# Patient Record
Sex: Female | Born: 1940 | Race: Black or African American | Hispanic: No | Marital: Married | Smoking: Never smoker
Health system: Southern US, Community
[De-identification: ages and names within clinical notes are randomized; demographics above are authoritative.]

## PROBLEM LIST (undated history)

## (undated) DIAGNOSIS — M1990 Unspecified osteoarthritis, unspecified site: Secondary | ICD-10-CM

## (undated) DIAGNOSIS — Z9109 Other allergy status, other than to drugs and biological substances: Secondary | ICD-10-CM

## (undated) HISTORY — PX: BIOPSY THYROID: PRO38

## (undated) HISTORY — PX: BREAST BIOPSY: SHX20

## (undated) HISTORY — PX: COLONOSCOPY: SHX174

---

## 2006-04-22 ENCOUNTER — Ambulatory Visit: Payer: Self-pay | Admitting: Internal Medicine

## 2006-06-03 ENCOUNTER — Ambulatory Visit: Payer: Self-pay | Admitting: Gastroenterology

## 2009-11-08 ENCOUNTER — Ambulatory Visit: Payer: Self-pay | Admitting: Family Medicine

## 2011-04-09 ENCOUNTER — Ambulatory Visit: Payer: Self-pay | Admitting: Family Medicine

## 2011-06-19 ENCOUNTER — Ambulatory Visit: Payer: Self-pay | Admitting: Family Medicine

## 2012-05-11 ENCOUNTER — Ambulatory Visit: Payer: Self-pay | Admitting: Family Medicine

## 2013-03-30 ENCOUNTER — Ambulatory Visit: Payer: Self-pay | Admitting: Family Medicine

## 2013-04-02 ENCOUNTER — Ambulatory Visit: Payer: Self-pay | Admitting: Family Medicine

## 2013-05-31 ENCOUNTER — Ambulatory Visit: Payer: Self-pay | Admitting: Family Medicine

## 2013-06-01 ENCOUNTER — Ambulatory Visit: Payer: Self-pay | Admitting: Family Medicine

## 2013-06-07 ENCOUNTER — Ambulatory Visit: Payer: Self-pay | Admitting: Family Medicine

## 2013-06-11 LAB — PATHOLOGY REPORT

## 2014-09-30 ENCOUNTER — Other Ambulatory Visit: Payer: Self-pay | Admitting: Family Medicine

## 2014-09-30 DIAGNOSIS — E049 Nontoxic goiter, unspecified: Secondary | ICD-10-CM

## 2014-10-10 ENCOUNTER — Ambulatory Visit: Payer: Self-pay

## 2015-02-01 IMAGING — US ULTRASOUND CORE BIOPSY
1 series · 13 of 25 positions shown · non-contrast
Comparison: none

CLINICAL DATA: Multi nodular goiter.

[Series 1: ultrasound core biopsy · 0.04mm/px · 13 of 35 slices shown]
[im 1/35]
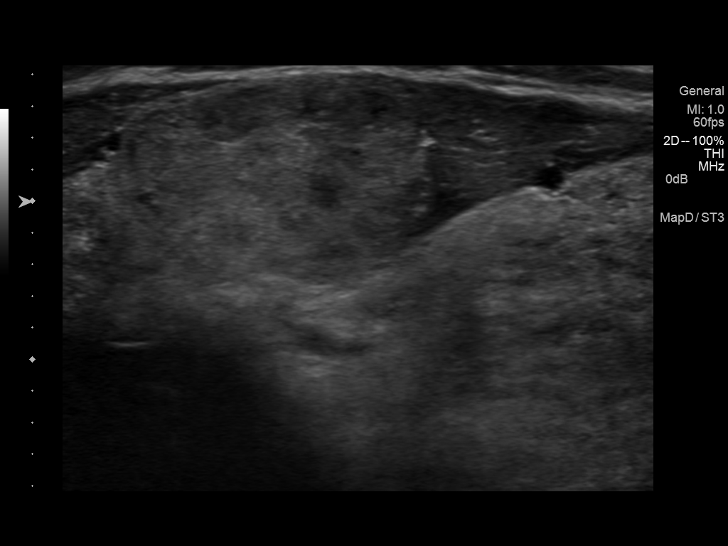
[im 3/35]
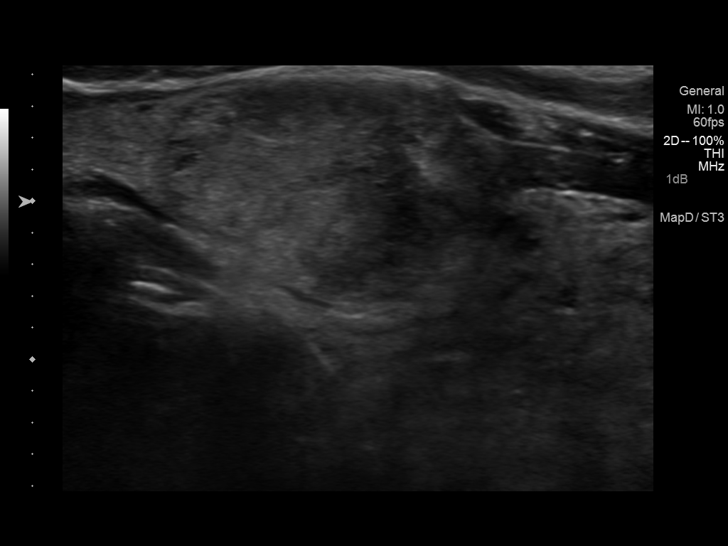
[im 6/35]
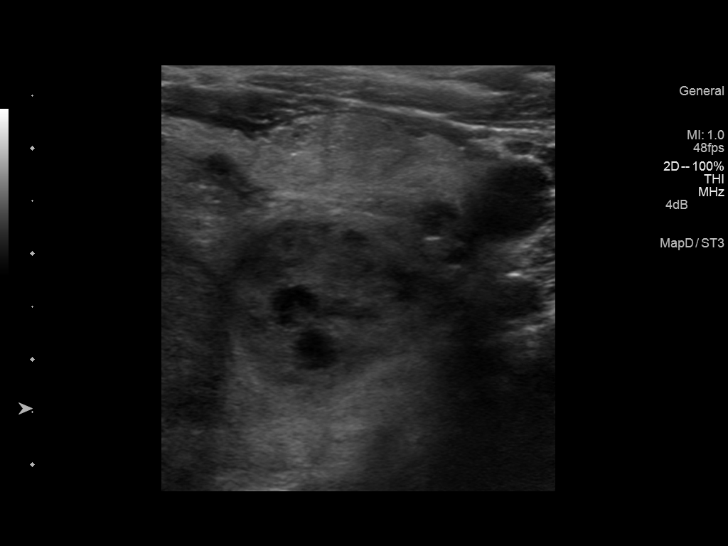
[im 9/35]
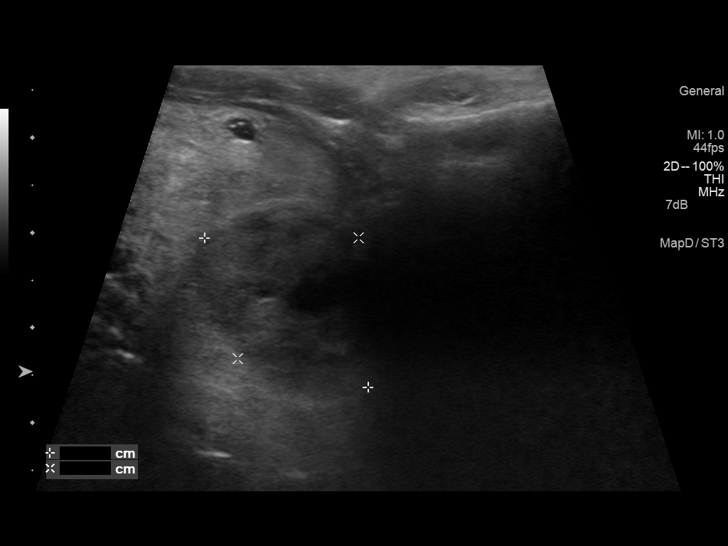
[im 12/35]
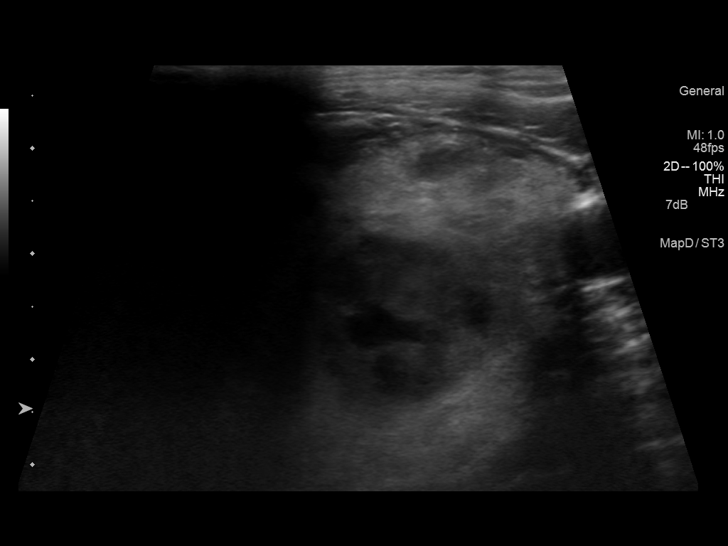
[im 15/35]
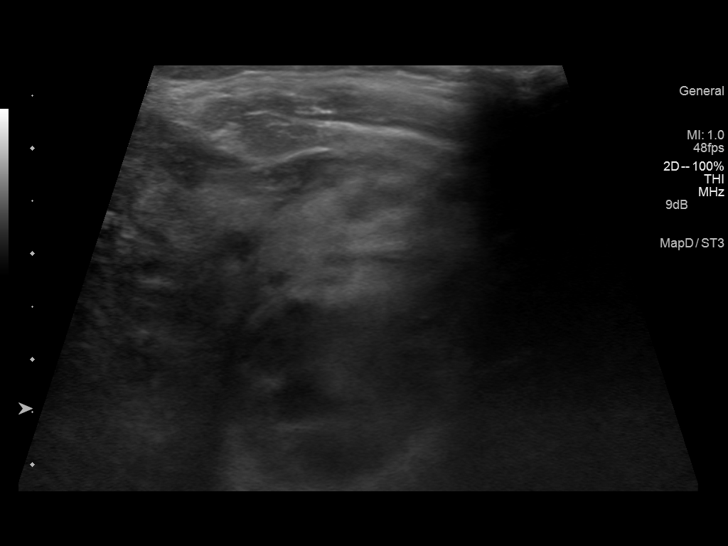
[im 18/35]
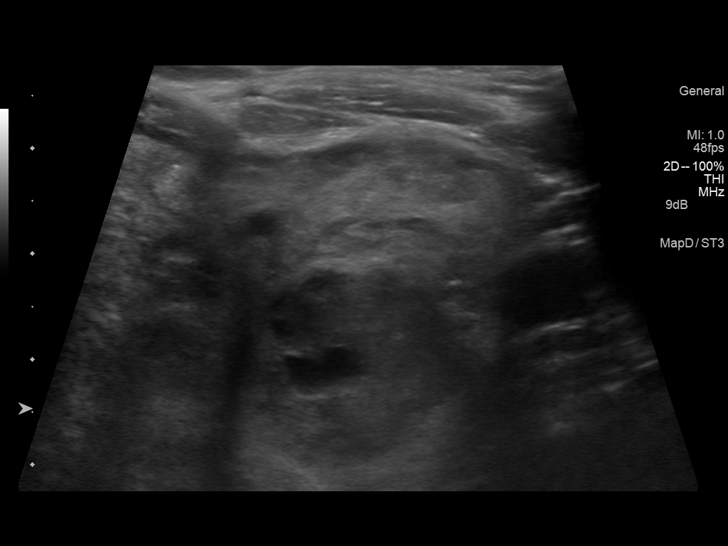
[im 20/35]
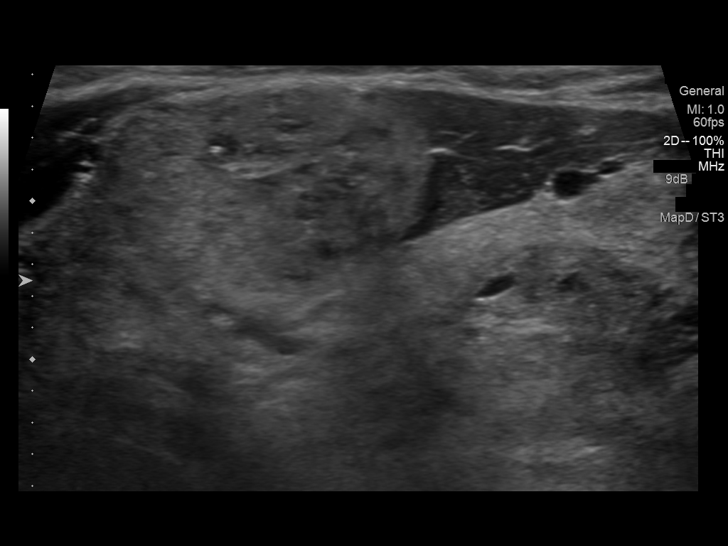
[im 23/35]
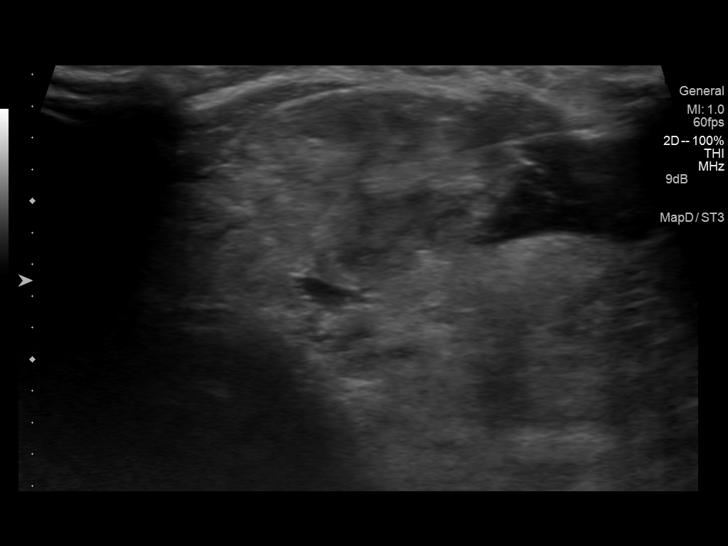
[im 26/35]
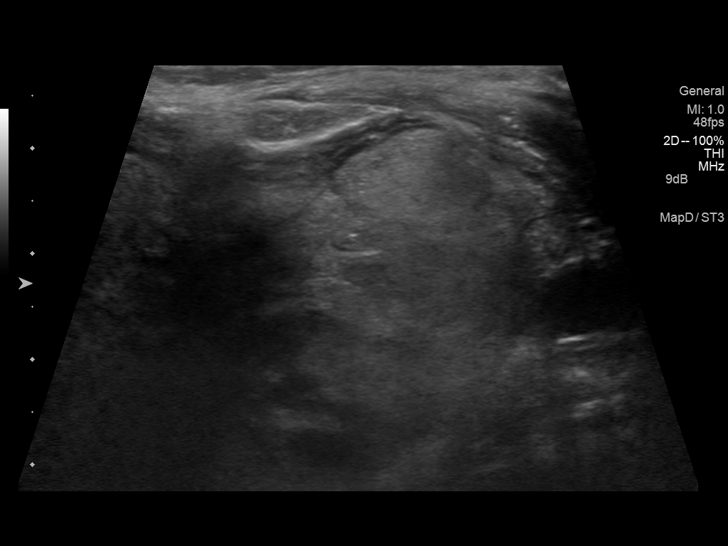
[im 29/35]
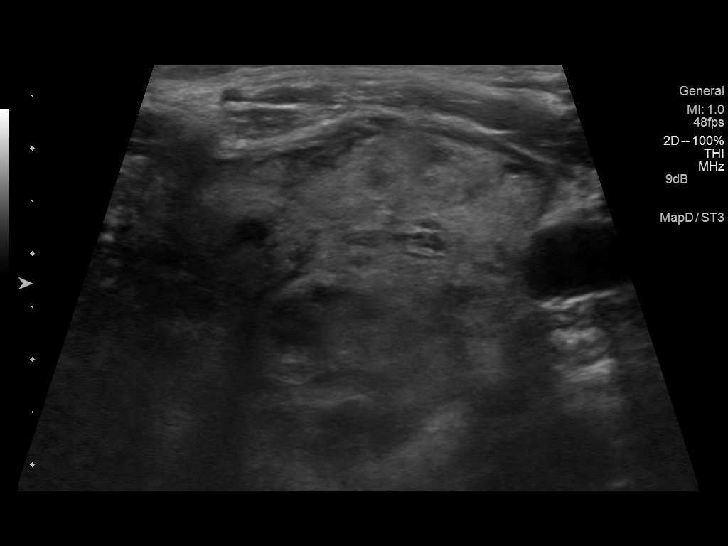
[im 32/35]
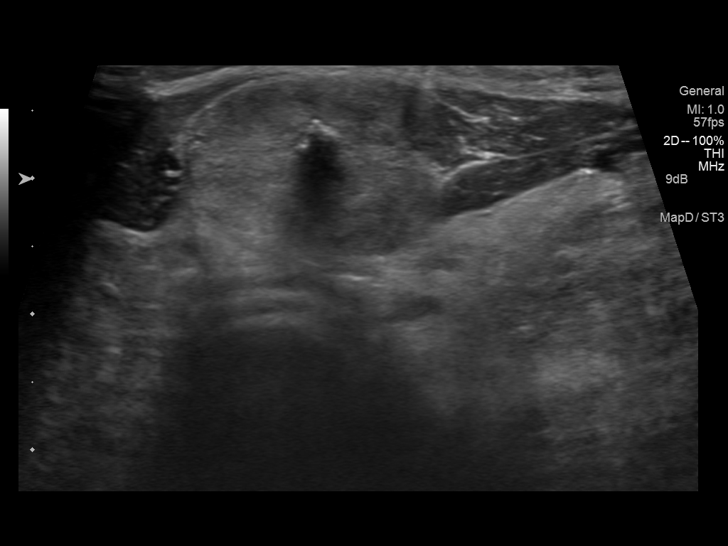
[im 35/35]
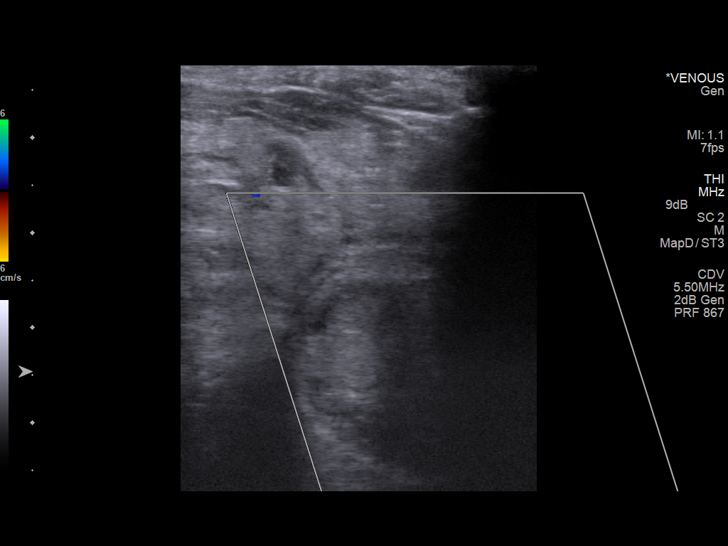

[13 of 25 positions shown; findings below may reference images not displayed]

EXAM:
ULTRASOUND GUIDED FINE NEEDLE ASPIRATION OF TWO THYROID NODULES

FLUOROSCOPY TIME:  None

MEDICATIONS:
None

ANESTHESIA/SEDATION:
Moderate sedation time:  None

PROCEDURE:
The procedure was explained to the patient. The risks and benefits
of the procedure were discussed and the patient's questions were
addressed. Informed consent was obtained from the patient. The neck
was evaluated with ultrasound. The neck was prepped with
chlorhexidine and a sterile field was created. The left side of the
neck was anesthetized with lidocaine. A total of 6 ultrasound guided
fine needle aspirations were obtained within the left thyroid nodule
using 25 gauge needles. The mid neck was anesthetized with 1%
lidocaine. Four ultrasound guided fine needle aspirations were
obtained in the isthmus thyroid nodule using 25 gauge needles.
FINDINGS: The thyroid tissue is diffusely heterogeneous. A discrete
heterogeneity hypoechoic nodule in the inferior left thyroid lobe
was sampled. The initial samples obtained primarily blood,
therefore, a total of 6 FNAs were obtained from this nodule. The
isthmus nodule is heterogeneous.

COMPLICATIONS:
None
IMPRESSION: Ultrasound-guided biopsy of the left thyroid nodule and isthmus
nodule.

## 2015-04-08 IMAGING — CR MM BREAST BX W/ LOC DEV 1ST LESION IMAGE BX SPEC STEREO GUIDE*R*
4 of 5 series · 6 of 8 positions shown · non-contrast
Comparison: Previous exams.

ADDENDUM:
PATHOLOGY ADDENDUM:

Pathology right breast calcifications: Ductal carcinoma in situ,
nuclear grade 2-3 with comedo necrosis
Pathology concordance with imaging findings: Yes
Recommendation: Consultation regarding treatment plan.
At the request of the patient, I spoke with her by telephone on
06/10/2013 at [DATE]. She reports doing well after the biopsy .
The findings and recommendations are discussed with JEVAN TIGER .
Per our discussion, the patient is considering her referral options
at this time.
CLINICAL DATA: Calcifications right breast for which stereotactic
biopsy was recommended.
EXAM:
RIGHT BREAST STEREOTACTIC CORE NEEDLE BIOPSY

[CC (1 of 4)]
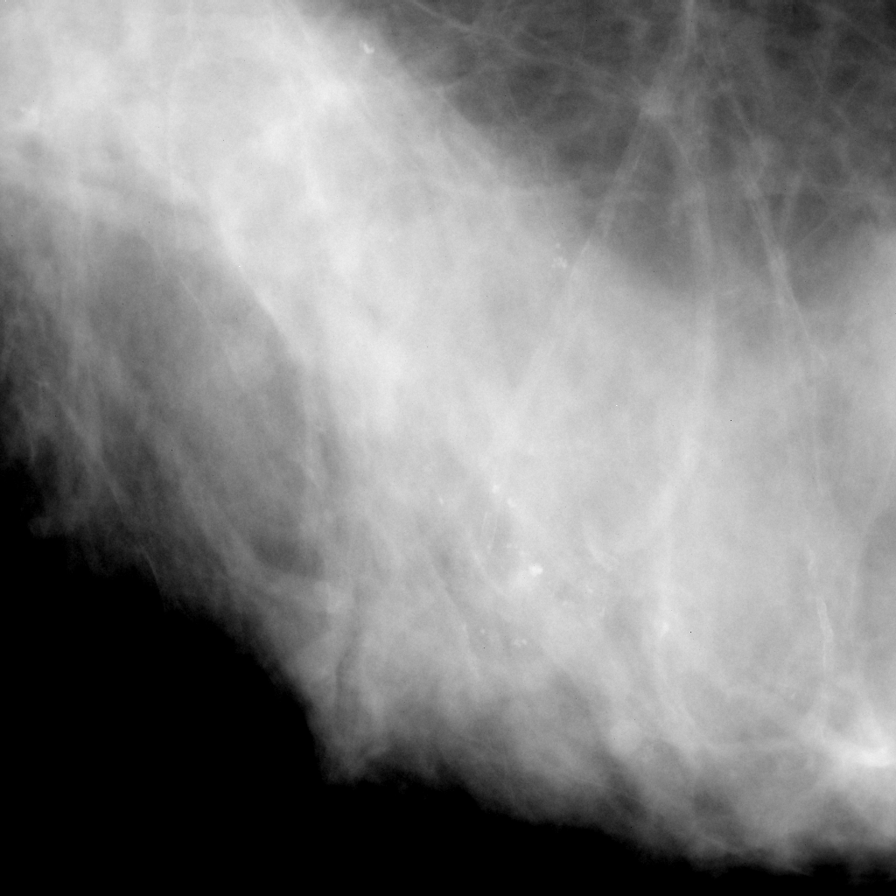

[Series 1: CC · right · 2 of 2 slices shown (2 of 4)]
[im 1/2]
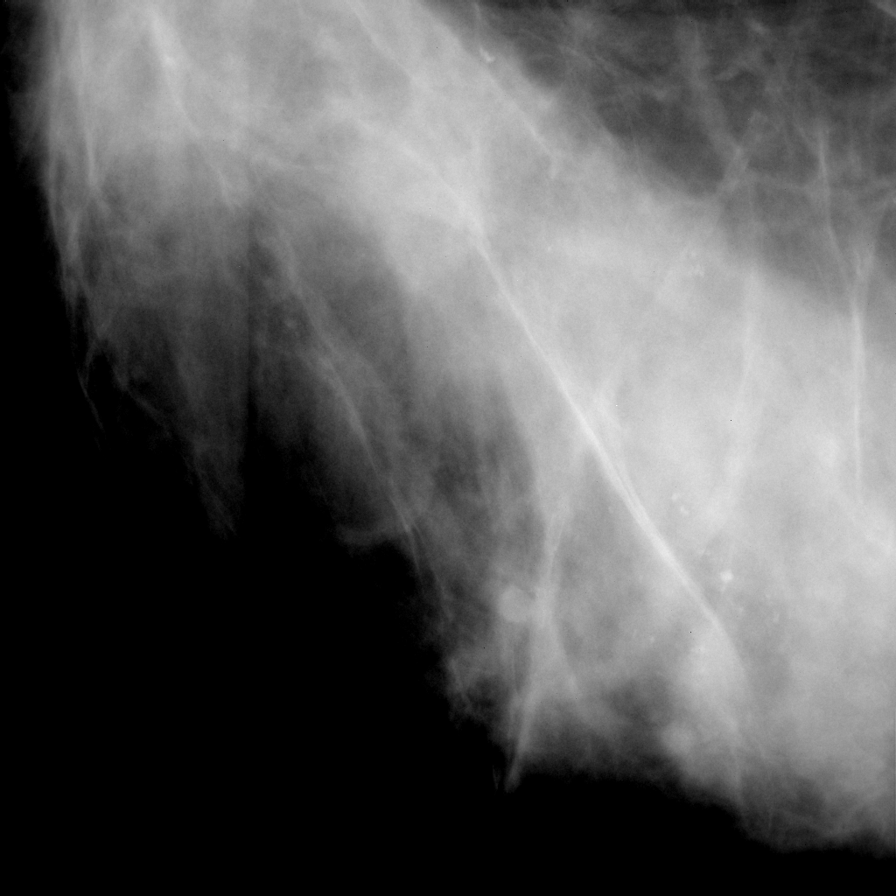
[im 2/2]
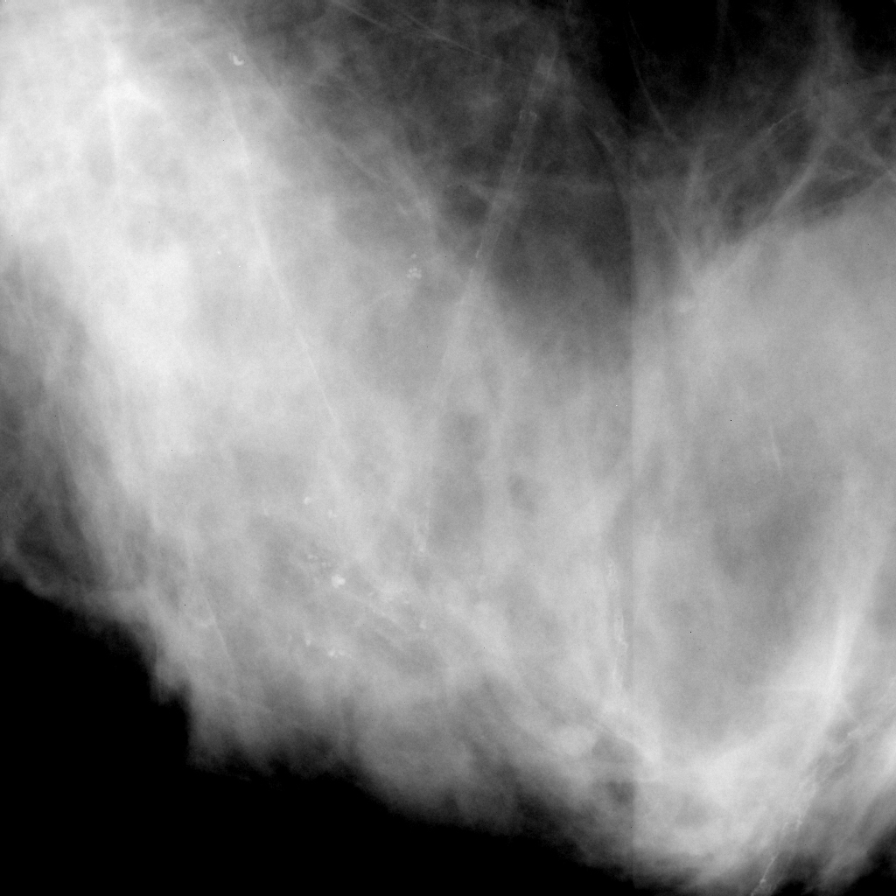

[Series 2: CC · right · 2 of 2 slices shown (3 of 4)]
[im 1/2]
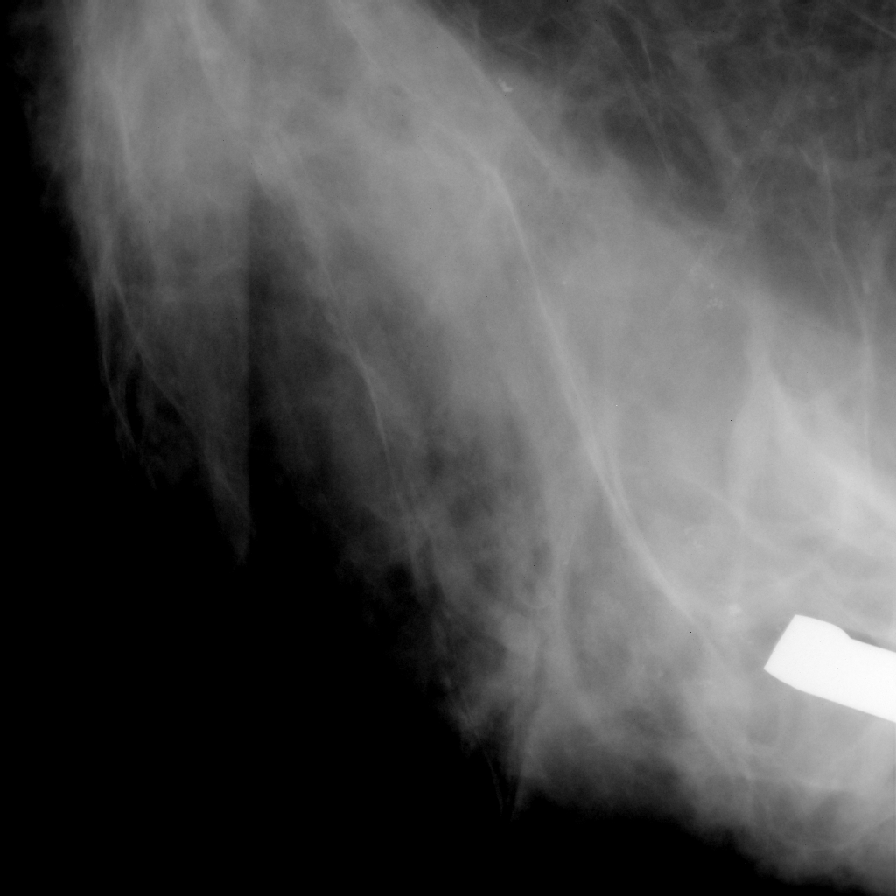
[im 2/2]
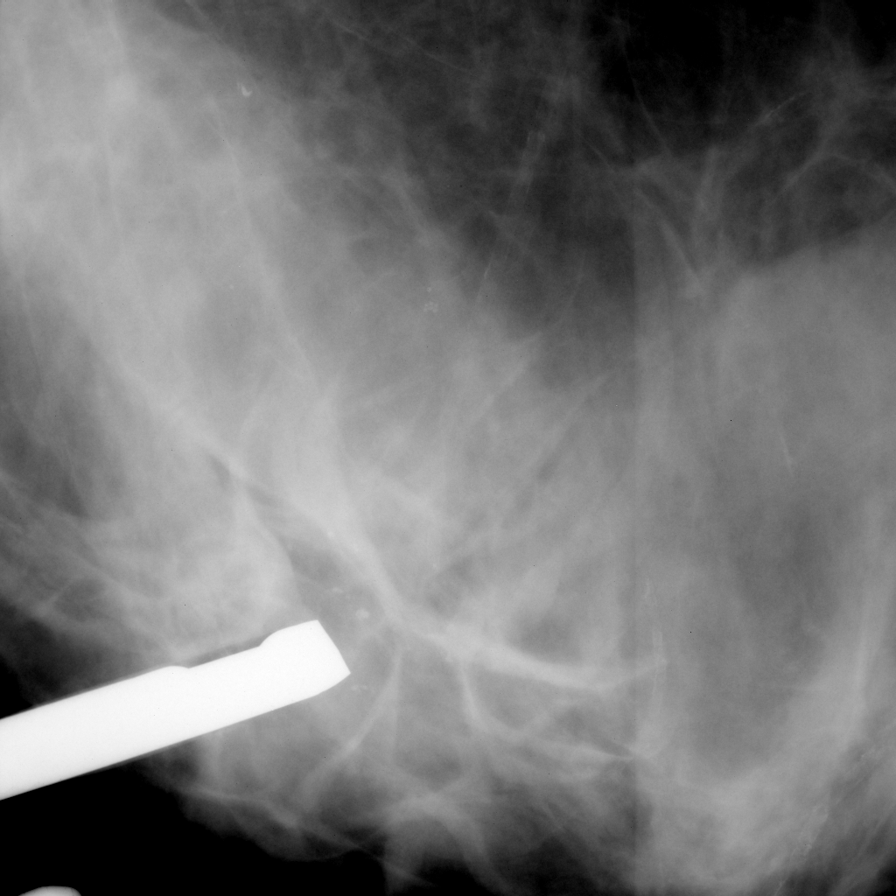

[CC (4 of 4)]
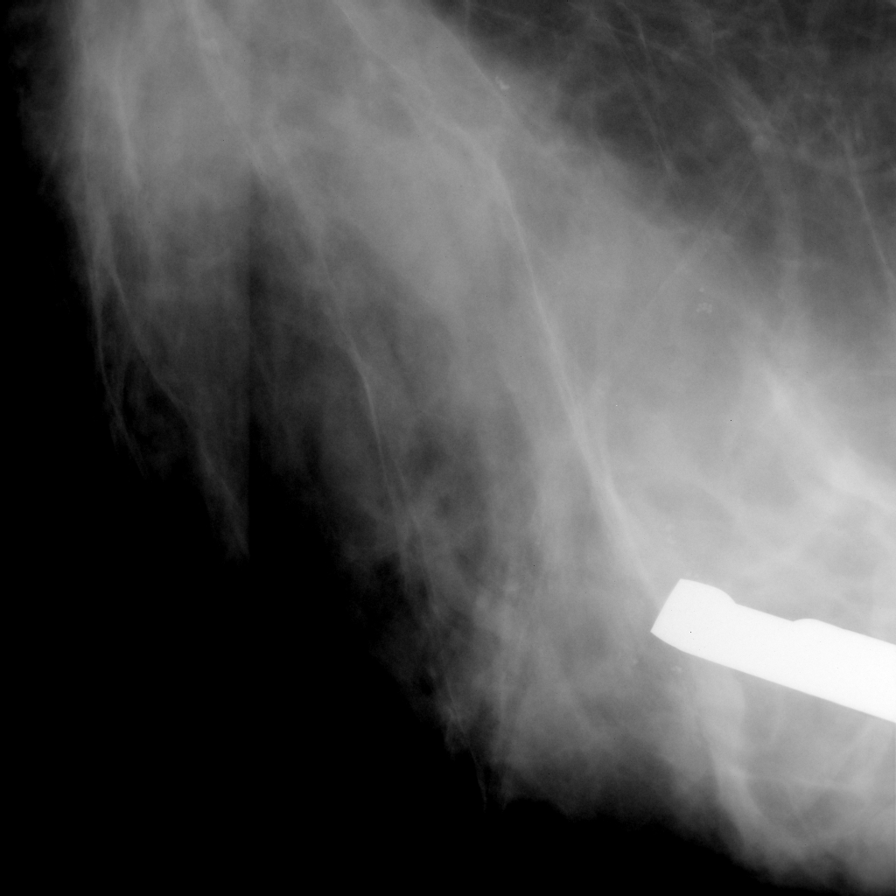

[6 of 8 positions shown; findings below may reference images not displayed]



Using sterile technique and 2% Lidocaine as local anesthetic, under
stereotactic guidance, a 9 gauge vacuum assisted device was used to
perform core needle biopsy of calcifications in the upper outer
quadrant of the right breast using a superior to inferior approach.
Specimen radiograph was performed showing multiple calcifications
within several cores. Specimens with calcifications are identified
for pathology.

At the conclusion of the procedure, a top hat shaped tissue marker
clip was deployed into the biopsy cavity. Follow-up 2-view mammogram
confirmed clip placement to be satisfactory.
IMPRESSION: Stereotactic-guided biopsy of the right breast. No apparent
complications.

## 2016-05-14 ENCOUNTER — Encounter: Payer: Self-pay | Admitting: *Deleted

## 2016-05-17 NOTE — Discharge Instructions (Signed)
Cataract Surgery, Care After °Refer to this sheet in the next few weeks. These instructions provide you with information about caring for yourself after your procedure. Your health care provider may also give you more specific instructions. Your treatment has been planned according to current medical practices, but problems sometimes occur. Call your health care provider if you have any problems or questions after your procedure. °What can I expect after the procedure? °After the procedure, it is common to have: °· Itching. °· Discomfort. °· Fluid discharge. °· Sensitivity to light and to touch. °· Bruising. °Follow these instructions at home: °Eye Care  °· Check your eye every day for signs of infection. Watch for: °¨ Redness, swelling, or pain. °¨ Fluid, blood, or pus. °¨ Warmth. °¨ Bad smell. °Activity  °· Avoid strenuous activities, such as playing contact sports, for as long as told by your health care provider. °· Do not drive or operate heavy machinery until your health care provider approves. °· Do not bend or lift heavy objects . Bending increases pressure in the eye. You can walk, climb stairs, and do light household chores. °· Ask your health care provider when you can return to work. If you work in a dusty environment, you may be advised to wear protective eyewear for a period of time. °General instructions  °· Take or apply over-the-counter and prescription medicines only as told by your health care provider. This includes eye drops. °· Do not touch or rub your eyes. °· If you were given a protective shield, wear it as told by your health care provider. If you were not given a protective shield, wear sunglasses as told by your health care provider to protect your eyes. °· Keep the area around your eye clean and dry. Avoid swimming or allowing water to hit you directly in the face while showering until told by your health care provider. Keep soap and shampoo out of your eyes. °· Do not put a contact lens  into the affected eye or eyes until your health care provider approves. °· Keep all follow-up visits as told by your health care provider. This is important. °Contact a health care provider if: ° °· You have increased bruising around your eye. °· You have pain that is not helped with medicine. °· You have a fever. °· You have redness, swelling, or pain in your eye. °· You have fluid, blood, or pus coming from your incision. °· Your vision gets worse. °Get help right away if: °· You have sudden vision loss. °This information is not intended to replace advice given to you by your health care provider. Make sure you discuss any questions you have with your health care provider. °Document Released: 07/27/2004 Document Revised: 05/18/2015 Document Reviewed: 11/17/2014 °Elsevier Interactive Patient Education © 2017 [employer] ° ° ° ° °General Anesthesia, Adult, Care After °These instructions provide you with information about caring for yourself after your procedure. Your health care provider may also give you more specific instructions. Your treatment has been planned according to current medical practices, but problems sometimes occur. Call your health care provider if you have any problems or questions after your procedure. °What can I expect after the procedure? °After the procedure, it is common to have: °· Vomiting. °· A sore throat. °· Mental slowness. °It is common to feel: °· Nauseous. °· Cold or shivery. °· Sleepy. °· Tired. °· Sore or achy, even in parts of your body where you did not have surgery. °Follow these instructions at   home: °For at least 24 hours after the procedure:  °· Do not: °¨ Participate in activities where you could fall or become injured. °¨ Drive. °¨ Use heavy machinery. °¨ Drink alcohol. °¨ Take sleeping pills or medicines that cause drowsiness. °¨ Make important decisions or sign legal documents. °¨ Take care of children on your own. °· Rest. °Eating and drinking  °· If you vomit, drink  water, juice, or soup when you can drink without vomiting. °· Drink enough fluid to keep your urine clear or pale yellow. °· Make sure you have little or no nausea before eating solid foods. °· Follow the diet recommended by your health care provider. °General instructions  °· Have a responsible adult stay with you until you are awake and alert. °· Return to your normal activities as told by your health care provider. Ask your health care provider what activities are safe for you. °· Take over-the-counter and prescription medicines only as told by your health care provider. °· If you smoke, do not smoke without supervision. °· Keep all follow-up visits as told by your health care provider. This is important. °Contact a health care provider if: °· You continue to have nausea or vomiting at home, and medicines are not helpful. °· You cannot drink fluids or start eating again. °· You cannot urinate after 8-12 hours. °· You develop a skin rash. °· You have fever. °· You have increasing redness at the site of your procedure. °Get help right away if: °· You have difficulty breathing. °· You have chest pain. °· You have unexpected bleeding. °· You feel that you are having a life-threatening or urgent problem. °This information is not intended to replace advice given to you by your health care provider. Make sure you discuss any questions you have with your health care provider. °Document Released: 04/15/2000 Document Revised: 06/12/2015 Document Reviewed: 12/22/2014 °Elsevier Interactive Patient Education © 2017 [employer] ° °

## 2016-05-20 ENCOUNTER — Ambulatory Visit: Payer: Medicare Other | Admitting: Anesthesiology

## 2016-05-20 ENCOUNTER — Encounter
Admission: RE | Disposition: A | Discharge status: Home / Self Care | Payer: Self-pay | Source: Ambulatory Visit | Attending: Ophthalmology

## 2016-05-20 ENCOUNTER — Ambulatory Visit
Admission: RE | Admit: 2016-05-20 | Discharge: 2016-05-20 | Disposition: A | Discharge status: Home / Self Care | Payer: Medicare Other | Source: Ambulatory Visit | Attending: Ophthalmology | Admitting: Ophthalmology

## 2016-05-20 DIAGNOSIS — H2511 Age-related nuclear cataract, right eye: Secondary | ICD-10-CM | POA: Diagnosis not present

## 2016-05-20 HISTORY — DX: Unspecified osteoarthritis, unspecified site: M19.90

## 2016-05-20 HISTORY — DX: Other allergy status, other than to drugs and biological substances: Z91.09

## 2016-05-20 HISTORY — PX: CATARACT EXTRACTION W/PHACO: SHX586

## 2016-05-20 SURGERY — PHACOEMULSIFICATION, CATARACT, WITH IOL INSERTION
Anesthesia: Monitor Anesthesia Care | Site: Eye | Laterality: Right | Wound class: Clean

## 2016-05-20 MED ORDER — MOXIFLOXACIN HCL 0.5 % OP SOLN
1.0000 [drp] | OPHTHALMIC | Status: DC | PRN
  Administered 2016-05-20 (×3): 1 [drp] via OPHTHALMIC

## 2016-05-20 MED ORDER — EPINEPHRINE PF 1 MG/ML IJ SOLN
INTRAOCULAR | Status: DC | PRN
  Administered 2016-05-20: 82 mL via OPHTHALMIC

## 2016-05-20 MED ORDER — FENTANYL CITRATE (PF) 100 MCG/2ML IJ SOLN
INTRAMUSCULAR | Status: DC | PRN
  Administered 2016-05-20: 50 ug via INTRAVENOUS

## 2016-05-20 MED ORDER — LIDOCAINE HCL (PF) 2 % IJ SOLN
INTRAMUSCULAR | Status: DC | PRN
  Administered 2016-05-20: 1 mL

## 2016-05-20 MED ORDER — NA HYALUR & NA CHOND-NA HYALUR 0.4-0.35 ML IO KIT
PACK | INTRAOCULAR | Status: DC | PRN
  Administered 2016-05-20: 1 mL via INTRAOCULAR

## 2016-05-20 MED ORDER — ARMC OPHTHALMIC DILATING DROPS
1.0000 "application " | OPHTHALMIC | Status: DC | PRN
  Administered 2016-05-20 (×3): 1 via OPHTHALMIC

## 2016-05-20 MED ORDER — CEFUROXIME OPHTHALMIC INJECTION 1 MG/0.1 ML
INJECTION | OPHTHALMIC | Status: DC | PRN
  Administered 2016-05-20: .2 mL via OPHTHALMIC

## 2016-05-20 MED ORDER — MIDAZOLAM HCL 2 MG/2ML IJ SOLN
INTRAMUSCULAR | Status: DC | PRN
  Administered 2016-05-20: 2 mg via INTRAVENOUS

## 2016-05-20 MED ORDER — BRIMONIDINE TARTRATE-TIMOLOL 0.2-0.5 % OP SOLN
OPHTHALMIC | Status: DC | PRN
  Administered 2016-05-20: 1 [drp] via OPHTHALMIC

## 2016-05-20 SURGICAL SUPPLY — 25 items
APPLICATOR COTTON TIP 3IN (MISCELLANEOUS) ×3 IMPLANT
CANNULA ANT/CHMB 27GA (MISCELLANEOUS) ×3 IMPLANT
DISSECTOR HYDRO NUCLEUS 50X22 (MISCELLANEOUS) ×3 IMPLANT
GLOVE BIO SURGEON STRL SZ7 (GLOVE) ×3 IMPLANT
GLOVE SURG LX 6.5 MICRO (GLOVE) ×2
GLOVE SURG LX STRL 6.5 MICRO (GLOVE) ×1 IMPLANT
GOWN STRL REUS W/ TWL LRG LVL3 (GOWN DISPOSABLE) ×2 IMPLANT
GOWN STRL REUS W/TWL LRG LVL3 (GOWN DISPOSABLE) ×4
KNIFE CLEARCUT SLIT (MISCELLANEOUS) ×3 IMPLANT
KNIFE SIDECUT EYE (MISCELLANEOUS) ×3 IMPLANT
LENS IOL ACRSF IQ ULTRA 24.0 (Intraocular Lens) ×1 IMPLANT
LENS IOL ACRYSOF IQ 24.0 (Intraocular Lens) ×3 IMPLANT
MARKER SKIN DUAL TIP RULER LAB (MISCELLANEOUS) ×3 IMPLANT
PACK CATARACT BRASINGTON (MISCELLANEOUS) IMPLANT
PACK EYE AFTER SURG (MISCELLANEOUS) ×3 IMPLANT
PACK OPTHALMIC (MISCELLANEOUS) ×3 IMPLANT
RING MALYGIN 7.0 (MISCELLANEOUS) IMPLANT
SUT ETHILON 10-0 CS-B-6CS-B-6 (SUTURE)
SUT VICRYL  9 0 (SUTURE)
SUT VICRYL 9 0 (SUTURE) IMPLANT
SUTURE EHLN 10-0 CS-B-6CS-B-6 (SUTURE) IMPLANT
SYR TB 1ML LUER SLIP (SYRINGE) ×3 IMPLANT
WATER STERILE IRR 250ML POUR (IV SOLUTION) ×3 IMPLANT
WICK EYE OCUCEL (MISCELLANEOUS) IMPLANT
WIPE NON LINTING 3.25X3.25 (MISCELLANEOUS) ×3 IMPLANT

## 2016-05-20 NOTE — Op Note (Signed)
Date of Surgery: 05/20/2016  PREOPERATIVE DIAGNOSES: Visually significant nuclear sclerotic cataract, right eye.  POSTOPERATIVE DIAGNOSES: Same  PROCEDURES PERFORMED: Cataract extraction with intraocular lens implant, right eye.  SURGEON: Almon Hercules, M.D.  ANESTHESIA: MAC and topical  IMPLANTS: [id number] +24.0 D  Implant Name Type Inv. Item Serial No. Manufacturer Lot No. LRB No. Used  LENS IOL ACRYSOF IQ 24.0 - [id number] Intraocular Lens LENS IOL ACRYSOF IQ 24.0 [id number] [employer]   Right 1     COMPLICATIONS: None.  DESCRIPTION OF PROCEDURE: Therapeutic options were discussed with the patient preoperatively, including a discussion of risks and benefits of surgery. Informed consent was obtained. An IOL-Master and immersion biometry were used to take the lens measurements, and a dilated fundus exam was performed within 6 months of the surgical date.  The patient was premedicated and brought to the operating room and placed on the operating table in the supine position. After adequate anesthesia, the patient was prepped and draped in the usual sterile ophthalmic fashion. A wire lid speculum was inserted and the microscope was positioned. A Superblade was used to create a paracentesis site at the limbus and a small amount of dilute preservative free lidocaine was instilled into the anterior chamber, followed by dispersive viscoelastic. A clear corneal incision was created temporally using a 2.4 mm keratome blade. Capsulorrhexis was then performed. In situ phacoemulsification was performed.  Cortical material was removed with the irrigation-aspiration unit. Dispersive viscoelastic was instilled to open the capsular bag. A posterior chamber intraocular lens with the specifications above was inserted and positioned. Irrigation-aspiration was used to remove all viscoelastic. Cefuroxime 1cc was instilled into the anterior chamber, and the corneal incision was checked and found to be water tight.  The eyelid speculum was removed.  The operative eye was covered with protective goggles after instilling 1 drop of timolol and brimonidine. The patient tolerated the procedure well. There were no complications.

## 2016-05-20 NOTE — H&P (Signed)
H+P reviewed and is up to date, please see paper chart.  

## 2016-05-20 NOTE — Anesthesia Procedure Notes (Signed)
Procedure Name: MAC Performed by: Mayme Genta Pre-anesthesia Checklist: Patient identified, Emergency Drugs available, Suction available, Timeout performed and Patient being monitored Patient Re-evaluated:Patient Re-evaluated prior to inductionOxygen Delivery Method: Nasal cannula Placement Confirmation: positive ETCO2

## 2016-05-20 NOTE — Transfer of Care (Signed)
Immediate Anesthesia Transfer of Care Note  Patient: Sydney Dixon  Procedure(s) Performed: Procedure(s): CATARACT EXTRACTION PHACO AND INTRAOCULAR LENS PLACEMENT (IOC)  Right (Right)  Patient Location: PACU  Anesthesia Type: MAC  Level of Consciousness: awake, alert  and patient cooperative  Airway and Oxygen Therapy: Patient Spontanous Breathing and Patient connected to supplemental oxygen  Post-op Assessment: Post-op Vital signs reviewed, Patient's Cardiovascular Status Stable, Respiratory Function Stable, Patent Airway and No signs of Nausea or vomiting  Post-op Vital Signs: Reviewed and stable  Complications: No apparent anesthesia complications

## 2016-05-20 NOTE — Anesthesia Postprocedure Evaluation (Signed)
Anesthesia Post Note  Patient: Sydney Dixon  Procedure(s) Performed: Procedure(s) (LRB): CATARACT EXTRACTION PHACO AND INTRAOCULAR LENS PLACEMENT (IOC)  Right (Right)  Patient location during evaluation: PACU Anesthesia Type: MAC Level of consciousness: awake Pain management: pain level controlled Vital Signs Assessment: post-procedure vital signs reviewed and stable Respiratory status: spontaneous breathing Cardiovascular status: blood pressure returned to baseline Postop Assessment: no headache Anesthetic complications: no    Lavonna Monarch

## 2016-05-20 NOTE — Anesthesia Preprocedure Evaluation (Signed)
Anesthesia Evaluation  Patient identified by MRN, date of birth, ID band Patient awake    Airway Mallampati: II  TM Distance: >3 FB Neck ROM: Full    Dental no notable dental hx.    Pulmonary neg pulmonary ROS,    Pulmonary exam normal breath sounds clear to auscultation       Cardiovascular negative cardio ROS Normal cardiovascular exam Rhythm:Regular Rate:Normal     Neuro/Psych negative neurological ROS  negative psych ROS   GI/Hepatic negative GI ROS, Neg liver ROS,   Endo/Other  negative endocrine ROS  Renal/GU negative Renal ROS  negative genitourinary   Musculoskeletal  (+) Arthritis ,   Abdominal Normal abdominal exam  (+)  Abdomen: soft.    Peds negative pediatric ROS (+)  Hematology negative hematology ROS (+)   Anesthesia Other Findings Breast Cancer R Breast  Reproductive/Obstetrics                             Anesthesia Physical Anesthesia Plan  ASA: II  Anesthesia Plan: MAC   Post-op Pain Management:    Induction: Intravenous  Airway Management Planned: Nasal Cannula  Additional Equipment: None  Intra-op Plan:   Post-operative Plan:   Informed Consent: I have reviewed the patients History and Physical, chart, labs and discussed the procedure including the risks, benefits and alternatives for the proposed anesthesia with the patient or authorized representative who has indicated his/her understanding and acceptance.     Plan Discussed with: CRNA, Anesthesiologist and Surgeon  Anesthesia Plan Comments:         Anesthesia Quick Evaluation

## 2016-05-21 ENCOUNTER — Encounter: Payer: Self-pay | Admitting: Ophthalmology

## 2016-06-04 ENCOUNTER — Encounter: Payer: Self-pay | Admitting: *Deleted

## 2016-06-07 NOTE — Discharge Instructions (Signed)
Cataract Surgery, Care After °Refer to this sheet in the next few weeks. These instructions provide you with information about caring for yourself after your procedure. Your health care provider may also give you more specific instructions. Your treatment has been planned according to current medical practices, but problems sometimes occur. Call your health care provider if you have any problems or questions after your procedure. °What can I expect after the procedure? °After the procedure, it is common to have: °· Itching. °· Discomfort. °· Fluid discharge. °· Sensitivity to light and to touch. °· Bruising. °Follow these instructions at home: °Eye Care  °· Check your eye every day for signs of infection. Watch for: °¨ Redness, swelling, or pain. °¨ Fluid, blood, or pus. °¨ Warmth. °¨ Bad smell. °Activity  °· Avoid strenuous activities, such as playing contact sports, for as long as told by your health care provider. °· Do not drive or operate heavy machinery until your health care provider approves. °· Do not bend or lift heavy objects . Bending increases pressure in the eye. You can walk, climb stairs, and do light household chores. °· Ask your health care provider when you can return to work. If you work in a dusty environment, you may be advised to wear protective eyewear for a period of time. °General instructions  °· Take or apply over-the-counter and prescription medicines only as told by your health care provider. This includes eye drops. °· Do not touch or rub your eyes. °· If you were given a protective shield, wear it as told by your health care provider. If you were not given a protective shield, wear sunglasses as told by your health care provider to protect your eyes. °· Keep the area around your eye clean and dry. Avoid swimming or allowing water to hit you directly in the face while showering until told by your health care provider. Keep soap and shampoo out of your eyes. °· Do not put a contact lens  into the affected eye or eyes until your health care provider approves. °· Keep all follow-up visits as told by your health care provider. This is important. °Contact a health care provider if: ° °· You have increased bruising around your eye. °· You have pain that is not helped with medicine. °· You have a fever. °· You have redness, swelling, or pain in your eye. °· You have fluid, blood, or pus coming from your incision. °· Your vision gets worse. °Get help right away if: °· You have sudden vision loss. °This information is not intended to replace advice given to you by your health care provider. Make sure you discuss any questions you have with your health care provider. °Document Released: 07/27/2004 Document Revised: 05/18/2015 Document Reviewed: 11/17/2014 °Elsevier Interactive Patient Education © 2017 [employer] ° ° ° ° °General Anesthesia, Adult, Care After °These instructions provide you with information about caring for yourself after your procedure. Your health care provider may also give you more specific instructions. Your treatment has been planned according to current medical practices, but problems sometimes occur. Call your health care provider if you have any problems or questions after your procedure. °What can I expect after the procedure? °After the procedure, it is common to have: °· Vomiting. °· A sore throat. °· Mental slowness. °It is common to feel: °· Nauseous. °· Cold or shivery. °· Sleepy. °· Tired. °· Sore or achy, even in parts of your body where you did not have surgery. °Follow these instructions at   home: °For at least 24 hours after the procedure:  °· Do not: °¨ Participate in activities where you could fall or become injured. °¨ Drive. °¨ Use heavy machinery. °¨ Drink alcohol. °¨ Take sleeping pills or medicines that cause drowsiness. °¨ Make important decisions or sign legal documents. °¨ Take care of children on your own. °· Rest. °Eating and drinking  °· If you vomit, drink  water, juice, or soup when you can drink without vomiting. °· Drink enough fluid to keep your urine clear or pale yellow. °· Make sure you have little or no nausea before eating solid foods. °· Follow the diet recommended by your health care provider. °General instructions  °· Have a responsible adult stay with you until you are awake and alert. °· Return to your normal activities as told by your health care provider. Ask your health care provider what activities are safe for you. °· Take over-the-counter and prescription medicines only as told by your health care provider. °· If you smoke, do not smoke without supervision. °· Keep all follow-up visits as told by your health care provider. This is important. °Contact a health care provider if: °· You continue to have nausea or vomiting at home, and medicines are not helpful. °· You cannot drink fluids or start eating again. °· You cannot urinate after 8-12 hours. °· You develop a skin rash. °· You have fever. °· You have increasing redness at the site of your procedure. °Get help right away if: °· You have difficulty breathing. °· You have chest pain. °· You have unexpected bleeding. °· You feel that you are having a life-threatening or urgent problem. °This information is not intended to replace advice given to you by your health care provider. Make sure you discuss any questions you have with your health care provider. °Document Released: 04/15/2000 Document Revised: 06/12/2015 Document Reviewed: 12/22/2014 °Elsevier Interactive Patient Education © 2017 [employer] ° °

## 2016-06-10 ENCOUNTER — Ambulatory Visit: Payer: Medicare Other | Admitting: Anesthesiology

## 2016-06-10 ENCOUNTER — Ambulatory Visit
Admission: RE | Admit: 2016-06-10 | Discharge: 2016-06-10 | Disposition: A | Discharge status: Home / Self Care | Payer: Medicare Other | Source: Ambulatory Visit | Attending: Ophthalmology | Admitting: Ophthalmology

## 2016-06-10 ENCOUNTER — Encounter
Admission: RE | Disposition: A | Discharge status: Home / Self Care | Payer: Self-pay | Source: Ambulatory Visit | Attending: Ophthalmology

## 2016-06-10 DIAGNOSIS — Z853 Personal history of malignant neoplasm of breast: Secondary | ICD-10-CM | POA: Diagnosis not present

## 2016-06-10 DIAGNOSIS — K219 Gastro-esophageal reflux disease without esophagitis: Secondary | ICD-10-CM | POA: Insufficient documentation

## 2016-06-10 DIAGNOSIS — H2512 Age-related nuclear cataract, left eye: Secondary | ICD-10-CM | POA: Diagnosis present

## 2016-06-10 DIAGNOSIS — M1990 Unspecified osteoarthritis, unspecified site: Secondary | ICD-10-CM | POA: Diagnosis not present

## 2016-06-10 HISTORY — PX: CATARACT EXTRACTION W/PHACO: SHX586

## 2016-06-10 SURGERY — PHACOEMULSIFICATION, CATARACT, WITH IOL INSERTION
Anesthesia: Monitor Anesthesia Care | Site: Eye | Laterality: Left | Wound class: Clean

## 2016-06-10 MED ORDER — LIDOCAINE HCL (PF) 2 % IJ SOLN
INTRAMUSCULAR | Status: DC | PRN
Start: 2016-06-10 — End: 2016-06-10
  Administered 2016-06-10: 1 mL

## 2016-06-10 MED ORDER — FENTANYL CITRATE (PF) 100 MCG/2ML IJ SOLN
INTRAMUSCULAR | Status: DC | PRN
  Administered 2016-06-10: 50 ug via INTRAVENOUS

## 2016-06-10 MED ORDER — CEFUROXIME OPHTHALMIC INJECTION 1 MG/0.1 ML
INJECTION | OPHTHALMIC | Status: DC | PRN
  Administered 2016-06-10: 0.1 mL via OPHTHALMIC

## 2016-06-10 MED ORDER — BRIMONIDINE TARTRATE-TIMOLOL 0.2-0.5 % OP SOLN
OPHTHALMIC | Status: DC | PRN
  Administered 2016-06-10: 1 [drp] via OPHTHALMIC

## 2016-06-10 MED ORDER — ARMC OPHTHALMIC DILATING DROPS
1.0000 "application " | OPHTHALMIC | Status: DC | PRN
  Administered 2016-06-10 (×3): 1 via OPHTHALMIC

## 2016-06-10 MED ORDER — MIDAZOLAM HCL 2 MG/2ML IJ SOLN
INTRAMUSCULAR | Status: DC | PRN
  Administered 2016-06-10: 2 mg via INTRAVENOUS

## 2016-06-10 MED ORDER — MOXIFLOXACIN HCL 0.5 % OP SOLN
1.0000 [drp] | OPHTHALMIC | Status: DC | PRN
  Administered 2016-06-10 (×3): 1 [drp] via OPHTHALMIC

## 2016-06-10 MED ORDER — NA HYALUR & NA CHOND-NA HYALUR 0.4-0.35 ML IO KIT
PACK | INTRAOCULAR | Status: DC | PRN
  Administered 2016-06-10: 1 mL via INTRAOCULAR

## 2016-06-10 MED ORDER — LACTATED RINGERS IV SOLN: 1000.0000 mL | INTRAVENOUS | Status: DC

## 2016-06-10 MED ORDER — EPINEPHRINE PF 1 MG/ML IJ SOLN
INTRAOCULAR | Status: DC | PRN
  Administered 2016-06-10: 72 mL via OPHTHALMIC

## 2016-06-10 SURGICAL SUPPLY — 23 items
APPLICATOR COTTON TIP 3IN (MISCELLANEOUS) ×3 IMPLANT
CANNULA ANT/CHMB 27GA (MISCELLANEOUS) ×3 IMPLANT
DISSECTOR HYDRO NUCLEUS 50X22 (MISCELLANEOUS) ×3 IMPLANT
GLOVE BIO SURGEON STRL SZ7 (GLOVE) ×3 IMPLANT
GLOVE SURG LX 6.5 MICRO (GLOVE) ×2
GLOVE SURG LX STRL 6.5 MICRO (GLOVE) ×1 IMPLANT
GOWN STRL REUS W/ TWL LRG LVL3 (GOWN DISPOSABLE) ×2 IMPLANT
GOWN STRL REUS W/TWL LRG LVL3 (GOWN DISPOSABLE) ×4
LENS IOL ACRSF IQ ULTRA 24.5 (Intraocular Lens) ×1 IMPLANT
LENS IOL ACRYSOF IQ 24.5 (Intraocular Lens) ×3 IMPLANT
MARKER SKIN DUAL TIP RULER LAB (MISCELLANEOUS) ×3 IMPLANT
PACK CATARACT BRASINGTON (MISCELLANEOUS) ×3 IMPLANT
PACK EYE AFTER SURG (MISCELLANEOUS) ×3 IMPLANT
PACK OPTHALMIC (MISCELLANEOUS) ×3 IMPLANT
RING MALYGIN 7.0 (MISCELLANEOUS) IMPLANT
SUT ETHILON 10-0 CS-B-6CS-B-6 (SUTURE)
SUT VICRYL  9 0 (SUTURE)
SUT VICRYL 9 0 (SUTURE) IMPLANT
SUTURE EHLN 10-0 CS-B-6CS-B-6 (SUTURE) IMPLANT
SYR TB 1ML LUER SLIP (SYRINGE) ×3 IMPLANT
WATER STERILE IRR 250ML POUR (IV SOLUTION) ×3 IMPLANT
WICK EYE OCUCEL (MISCELLANEOUS) IMPLANT
WIPE NON LINTING 3.25X3.25 (MISCELLANEOUS) ×3 IMPLANT

## 2016-06-10 NOTE — Anesthesia Postprocedure Evaluation (Signed)
Anesthesia Post Note  Patient: Sydney Dixon  Procedure(s) Performed: Procedure(s) (LRB): CATARACT EXTRACTION PHACO AND INTRAOCULAR LENS PLACEMENT (IOC)  Left (Left)  Patient location during evaluation: PACU Anesthesia Type: MAC Level of consciousness: awake Pain management: pain level controlled Vital Signs Assessment: post-procedure vital signs reviewed and stable Respiratory status: spontaneous breathing Cardiovascular status: blood pressure returned to baseline Postop Assessment: no headache Anesthetic complications: no    Lavonna Monarch

## 2016-06-10 NOTE — Anesthesia Procedure Notes (Signed)
Procedure Name: MAC Performed by: Mayme Genta Pre-anesthesia Checklist: Patient identified, Emergency Drugs available, Suction available, Timeout performed and Patient being monitored Patient Re-evaluated:Patient Re-evaluated prior to inductionOxygen Delivery Method: Nasal cannula Placement Confirmation: positive ETCO2

## 2016-06-10 NOTE — H&P (Signed)
H+P reviewed and is up to date, please see paper chart.  

## 2016-06-10 NOTE — Anesthesia Preprocedure Evaluation (Addendum)
Anesthesia Evaluation  Patient identified by MRN, date of birth, ID band Patient awake    Reviewed: Allergy & Precautions, NPO status , Patient's Chart, lab work & pertinent test results, reviewed documented beta blocker date and time   Airway Mallampati: II  TM Distance: >3 FB Neck ROM: Full    Dental no notable dental hx.    Pulmonary neg pulmonary ROS,    Pulmonary exam normal breath sounds clear to auscultation       Cardiovascular negative cardio ROS Normal cardiovascular exam Rhythm:Regular Rate:Normal     Neuro/Psych negative neurological ROS  negative psych ROS   GI/Hepatic negative GI ROS, Neg liver ROS,   Endo/Other  negative endocrine ROS  Renal/GU negative Renal ROS  negative genitourinary   Musculoskeletal  (+) Arthritis ,   Abdominal Normal abdominal exam  (+)  Abdomen: soft.    Peds  Hematology R DCIS   Anesthesia Other Findings   Reproductive/Obstetrics                            Anesthesia Physical Anesthesia Plan  ASA: II  Anesthesia Plan: MAC   Post-op Pain Management:    Induction:   Airway Management Planned: Nasal Cannula  Additional Equipment: None  Intra-op Plan:   Post-operative Plan:   Informed Consent: I have reviewed the patients History and Physical, chart, labs and discussed the procedure including the risks, benefits and alternatives for the proposed anesthesia with the patient or authorized representative who has indicated his/her understanding and acceptance.     Plan Discussed with: CRNA, Anesthesiologist and Surgeon  Anesthesia Plan Comments:         Anesthesia Quick Evaluation

## 2016-06-10 NOTE — Transfer of Care (Signed)
Immediate Anesthesia Transfer of Care Note  Patient: Sydney Dixon  Procedure(s) Performed: Procedure(s): CATARACT EXTRACTION PHACO AND INTRAOCULAR LENS PLACEMENT (IOC)  Left (Left)  Patient Location: PACU  Anesthesia Type: MAC  Level of Consciousness: awake, alert  and patient cooperative  Airway and Oxygen Therapy: Patient Spontanous Breathing and Patient connected to supplemental oxygen  Post-op Assessment: Post-op Vital signs reviewed, Patient's Cardiovascular Status Stable, Respiratory Function Stable, Patent Airway and No signs of Nausea or vomiting  Post-op Vital Signs: Reviewed and stable  Complications: No apparent anesthesia complications

## 2016-06-10 NOTE — Op Note (Signed)
Date of Surgery: 06/10/2016  PREOPERATIVE DIAGNOSES: Visually significant nuclear sclerotic cataract, left eye.  POSTOPERATIVE DIAGNOSES: Same  PROCEDURES PERFORMED: Cataract extraction with intraocular lens implant, left eye.  SURGEON: Almon Hercules, M.D.  ANESTHESIA: MAC and topical  IMPLANTS: [id number] +24.5 D  Implant Name Type Inv. Item Serial No. Manufacturer Lot No. LRB No. Used  LENS IOL ACRYSOF IQ 24.5 - [id number] Intraocular Lens LENS IOL ACRYSOF IQ 24.5 [id number] [employer]   Left 1    COMPLICATIONS: None.  DESCRIPTION OF PROCEDURE: Therapeutic options were discussed with the patient preoperatively, including a discussion of risks and benefits of surgery. Informed consent was obtained. An IOL-Master and immersion biometry were used to take the lens measurements, and a dilated fundus exam was performed within 6 months of the surgical date.  The patient was premedicated and brought to the operating room and placed on the operating table in the supine position. After adequate anesthesia, the patient was prepped and draped in the usual sterile ophthalmic fashion. A wire lid speculum was inserted and the microscope was positioned. A Superblade was used to create a paracentesis site at the limbus and a small amount of dilute preservative free lidocaine was instilled into the anterior chamber, followed by dispersive viscoelastic. A clear corneal incision was created temporally using a 2.4 mm keratome blade. Capsulorrhexis was then performed. In situ phacoemulsification was performed.  Cortical material was removed with the irrigation-aspiration unit. Dispersive viscoelastic was instilled to open the capsular bag. A posterior chamber intraocular lens with the specifications above was inserted and positioned. Irrigation-aspiration was used to remove all viscoelastic. Cefuroxime 1cc was instilled into the anterior chamber, and the corneal incision was checked and found to be water tight. The  eyelid speculum was removed.  The operative eye was covered with protective goggles after instilling 1 drop of timolol and brimonidine. The patient tolerated the procedure well. There were no complications.

## 2016-06-11 ENCOUNTER — Encounter: Payer: Self-pay | Admitting: Ophthalmology
